# Patient Record
Sex: Male | Born: 2000 | Race: White | Hispanic: No | Marital: Single | Smoking: Never smoker
Health system: Southern US, Community
[De-identification: ages and names within clinical notes are randomized; demographics above are authoritative.]

## PROBLEM LIST (undated history)

## (undated) DIAGNOSIS — R569 Unspecified convulsions: Secondary | ICD-10-CM

## (undated) DIAGNOSIS — T7840XA Allergy, unspecified, initial encounter: Secondary | ICD-10-CM

---
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
## 2021-05-06 DIAGNOSIS — D229 Melanocytic nevi, unspecified: Secondary | ICD-10-CM | POA: Diagnosis not present

## 2021-05-06 DIAGNOSIS — Z7189 Other specified counseling: Secondary | ICD-10-CM | POA: Diagnosis not present

## 2021-05-06 DIAGNOSIS — Z23 Encounter for immunization: Secondary | ICD-10-CM | POA: Diagnosis not present

## 2022-03-17 DIAGNOSIS — L988 Other specified disorders of the skin and subcutaneous tissue: Secondary | ICD-10-CM | POA: Diagnosis not present

## 2022-04-15 DIAGNOSIS — B86 Scabies: Secondary | ICD-10-CM | POA: Diagnosis not present

## 2022-04-28 DIAGNOSIS — Z0000 Encounter for general adult medical examination without abnormal findings: Secondary | ICD-10-CM | POA: Diagnosis not present

## 2022-05-04 DIAGNOSIS — Z0000 Encounter for general adult medical examination without abnormal findings: Secondary | ICD-10-CM | POA: Diagnosis not present

## 2022-06-23 DIAGNOSIS — R519 Headache, unspecified: Secondary | ICD-10-CM | POA: Diagnosis not present

## 2022-06-23 DIAGNOSIS — J060 Acute laryngopharyngitis: Secondary | ICD-10-CM | POA: Diagnosis not present

## 2022-06-23 DIAGNOSIS — J069 Acute upper respiratory infection, unspecified: Secondary | ICD-10-CM | POA: Diagnosis not present

## 2022-11-11 ENCOUNTER — Ambulatory Visit: Payer: BC Managed Care – PPO | Admitting: Podiatry

## 2022-11-11 ENCOUNTER — Ambulatory Visit (INDEPENDENT_AMBULATORY_CARE_PROVIDER_SITE_OTHER): Payer: BC Managed Care – PPO | Admitting: Podiatry

## 2022-11-11 DIAGNOSIS — L600 Ingrowing nail: Secondary | ICD-10-CM

## 2022-11-11 DIAGNOSIS — L03031 Cellulitis of right toe: Secondary | ICD-10-CM

## 2022-11-11 MED ORDER — CEPHALEXIN 500 MG PO CAPS: 500.0000 mg | ORAL_CAPSULE | Freq: Three times a day (TID) | ORAL | 0 refills | Status: AC

## 2022-11-11 NOTE — Progress Notes (Signed)
  Subjective:  Patient ID: Kenneth Valencia, male    DOB: Mar 20, 2001,  MRN: [id number]  Chief Complaint  Patient presents with   Ingrown Toenail    Pt is here for ingrown nail that he has had since march and is no infected    22 y.o. male presents with concern for ingrown nail on the bilateral border of the right great toenail.  He has had this since mid March.  Has been having pain redness swelling and drainage on the nail borders.  Not been on any antibiotics.  Past Medical History:  Diagnosis Date   Allergy    seasonal allergies   Seizures (HCC)    seizures at 20 months, none since then    No Known Allergies  ROS: Negative except as per HPI above  Objective:  General: AAO x3, NAD  Dermatological: Incurvation is present along the bilateral nail border of the right great toe. There is localized edema without any erythema or increase in warmth around the nail border. There is no drainage or pus. There is no ascending cellulitis. No malodor. No open lesions or pre-ulcerative lesions.    Vascular:  Dorsalis Pedis artery and Posterior Tibial artery pedal pulses are 2/4 bilateral.  Capillary fill time < 3 sec to all digits.   Neruologic: Grossly intact via light touch bilateral. Protective threshold intact to all sites bilateral.   Musculoskeletal: No gross boney pedal deformities bilateral. No pain, crepitus, or limitation noted with foot and ankle range of motion bilateral. Muscular strength 5/5 in all groups tested bilateral.  Gait: Unassisted, Nonantalgic.   No images are attached to the encounter.  Assessment:   1. Ingrown nail of great toe of right foot   2. Paronychia of great toe of right foot      Plan:  Patient was evaluated and treated and all questions answered.    Ingrown Nail, right -Patient elects to proceed with minor surgery to remove ingrown toenail today. Consent reviewed and signed by patient. -Ingrown nail excised. See procedure note. -Educated on  post-procedure care including soaking. Written instructions provided and reviewed. -Patient to follow up in 2 weeks for nail check. -E Rx for cephalexin 500 mg 3 times daily for 5 days for mild paronychia of the right hallux  Procedure: Excision of Ingrown Toenail Location: Right 1st toe  bilateral  nail borders. Anesthesia: Lidocaine 1% plain; 1.5 mL and Marcaine 0.5% plain; 1.5 mL, digital block. Skin Prep: Betadine. Dressing: Silvadene; telfa; dry, sterile, compression dressing. Technique: Following skin prep, the toe was exsanguinated and a tourniquet was secured at the base of the toe. The affected nail border was freed, split with a nail splitter, and excised. Chemical matrixectomy was then performed with phenol and irrigated out with alcohol. The tourniquet was then removed and sterile dressing applied. Disposition: Patient tolerated procedure well. Patient to return in 2 weeks for follow-up.    Return in about 2 weeks (around 11/25/2022) for nail check.          Corinna Gab, DPM [facility] / [facility]

## 2022-11-11 NOTE — Patient Instructions (Signed)

## 2022-11-14 ENCOUNTER — Ambulatory Visit: Payer: BC Managed Care – PPO | Admitting: Podiatry

## 2022-11-25 ENCOUNTER — Ambulatory Visit: Payer: PRIVATE HEALTH INSURANCE | Admitting: Podiatry

## 2023-01-11 DIAGNOSIS — J029 Acute pharyngitis, unspecified: Secondary | ICD-10-CM | POA: Diagnosis not present

## 2023-01-11 DIAGNOSIS — J069 Acute upper respiratory infection, unspecified: Secondary | ICD-10-CM | POA: Diagnosis not present

## 2023-01-11 DIAGNOSIS — Z1152 Encounter for screening for COVID-19: Secondary | ICD-10-CM | POA: Diagnosis not present

## 2023-05-17 DIAGNOSIS — J029 Acute pharyngitis, unspecified: Secondary | ICD-10-CM | POA: Diagnosis not present

## 2023-08-22 DIAGNOSIS — J069 Acute upper respiratory infection, unspecified: Secondary | ICD-10-CM | POA: Diagnosis not present

## 2023-08-22 DIAGNOSIS — R070 Pain in throat: Secondary | ICD-10-CM | POA: Diagnosis not present

## 2023-10-02 ENCOUNTER — Ambulatory Visit (INDEPENDENT_AMBULATORY_CARE_PROVIDER_SITE_OTHER): Payer: PRIVATE HEALTH INSURANCE | Admitting: Podiatry

## 2023-10-02 ENCOUNTER — Encounter: Payer: Self-pay | Admitting: Podiatry

## 2023-10-02 DIAGNOSIS — L03031 Cellulitis of right toe: Secondary | ICD-10-CM

## 2023-10-02 MED ORDER — CEPHALEXIN 500 MG PO CAPS: 500.0000 mg | ORAL_CAPSULE | Freq: Three times a day (TID) | ORAL | 0 refills | Status: AC

## 2023-10-02 NOTE — Patient Instructions (Addendum)
 You can start a Biotin supplement as well to help the nail growth  ---    Soak Instructions    THE DAY AFTER THE PROCEDURE  Place 1/4 cup of epsom salts in a quart of warm tap water.  Submerge your foot or feet with outer bandage intact for the initial soak; this will allow the bandage to become moist and wet for easy lift off.  Once you remove your bandage, continue to soak in the solution for 20 minutes.  This soak should be done twice a day.  Next, remove your foot or feet from solution, blot dry the affected area and cover.  You may use a band aid large enough to cover the area or use gauze and tape.  Apply other medications to the area as directed by the doctor such as polysporin neosporin.  IF YOUR SKIN BECOMES IRRITATED WHILE USING THESE INSTRUCTIONS, IT IS OKAY TO SWITCH TO  WHITE VINEGAR AND WATER. Or you may use antibacterial soap and water to keep the toe clean  Monitor for any signs/symptoms of infection. Call the office immediately if any occur or go directly to the emergency room. Call with any questions/concerns.

## 2023-10-02 NOTE — Progress Notes (Unsigned)
 Chief Complaint  Patient presents with   Ingrown Toenail    RM#11 Right foot big toe nail infected symptoms since 07/2023.

## 2023-10-17 ENCOUNTER — Ambulatory Visit: Payer: Self-pay | Admitting: Podiatry
